# Patient Record
Sex: Male | Born: 1985 | Race: White | Hispanic: No | Marital: Married | Smoking: Never smoker
Health system: Southern US, Community
[De-identification: ages and names within clinical notes are randomized; demographics above are authoritative.]

---

## 2015-12-14 ENCOUNTER — Emergency Department
Admission: EM | Admit: 2015-12-14 | Discharge: 2015-12-14 | Disposition: A | Discharge status: Home / Self Care | Payer: BLUE CROSS/BLUE SHIELD | Attending: Emergency Medicine | Admitting: Emergency Medicine

## 2015-12-14 ENCOUNTER — Emergency Department: Payer: BLUE CROSS/BLUE SHIELD

## 2015-12-14 DIAGNOSIS — I82402 Acute embolism and thrombosis of unspecified deep veins of left lower extremity: Secondary | ICD-10-CM | POA: Insufficient documentation

## 2015-12-14 DIAGNOSIS — M79605 Pain in left leg: Secondary | ICD-10-CM | POA: Diagnosis present

## 2015-12-14 MED ORDER — APIXABAN 5 MG PO TABS
ORAL_TABLET | ORAL | Status: AC
  Administered 2015-12-14: 10 mg via ORAL
  Filled 2015-12-14: qty 2

## 2015-12-14 MED ORDER — APIXABAN 5 MG PO TABS
10.0000 mg | ORAL_TABLET | Freq: Once | ORAL | Status: AC
  Administered 2015-12-14: 10 mg via ORAL

## 2015-12-14 MED ORDER — APIXABAN 5 MG PO TABS: ORAL_TABLET | ORAL | 0 refills | Status: DC

## 2015-12-14 NOTE — ED Notes (Signed)
Patient transported to Ultrasound 

## 2015-12-14 NOTE — ED Notes (Signed)
Pt discharged to home.  Family member driving.  Discharge instructions reviewed.  Verbalized understanding.  No questions or concerns at this time.  Teach back verified.  Pt in NAD.  No items left in ED.   

## 2015-12-14 NOTE — ED Provider Notes (Signed)
[facility] Emergency Department Provider Note   ____________________________________________   First MD Initiated Contact with Patient 12/14/15 1643     (approximate)  I have reviewed the triage vital signs and the nursing notes.   HISTORY  Chief Complaint Leg Pain   HPI Jacolyn ReedyRobert Larke is a 30 y.o. male with family history of clotting disorders and multiple DVTs and his family who is presenting with left popliteal fossa pain as well as erythema. He says that he thinks he may have a superficial thrombophlebitis but was sent in by his family with a concern for DVT. He says he was having pain to his left popliteal fossa yesterday but noticed the erythema today. He denies any recent long trips in a car or plane. Denies any injury. Says he also has a history of varicose veins and has pain along the posterior varicose vein of the popliteal fossa as well. He says that he himself has never had any blood clots. Denies any chest pain or shortness of breath. Denies any swelling to the bilateral lower extremity is.   History reviewed. No pertinent past medical history.  There are no active problems to display for this patient.   History reviewed. No pertinent surgical history.  Prior to Admission medications   Not on File    Allergies Review of patient's allergies indicates no known allergies.  No family history on file.  Social History Social History  Substance Use Topics  . Smoking status: Never Smoker  . Smokeless tobacco: Never Used  . Alcohol use Yes     Comment: socially    Review of Systems Constitutional: No fever/chills Eyes: No visual changes. ENT: No sore throat. Cardiovascular: Denies chest pain. Respiratory: Denies shortness of breath. Gastrointestinal: No abdominal pain.  No nausea, no vomiting.  No diarrhea.  No constipation. Genitourinary: Negative for dysuria. Musculoskeletal: Negative for back pain. Skin: As above Neurological:  Negative for headaches, focal weakness or numbness.  10-point ROS otherwise negative.  ____________________________________________   PHYSICAL EXAM:  VITAL SIGNS: ED Triage Vitals [12/14/15 1622]  Enc Vitals Group     BP 132/86     Pulse Rate (!) 101     Resp 18     Temp 98.1 F (36.7 C)     Temp Source Oral     SpO2 100 %     Weight 190 lb (86.2 kg)     Height 6\' 4"  (1.93 m)     Head Circumference      Peak Flow      Pain Score 1     Pain Loc      Pain Edu?      Excl. in GC?     Constitutional: Alert and oriented. Well appearing and in no acute distress. Eyes: Conjunctivae are normal. PERRL. EOMI. Head: Atraumatic. Nose: No congestion/rhinnorhea. Mouth/Throat: Mucous membranes are moist.  Neck: No stridor.   Cardiovascular: Normal rate, regular rhythm. Grossly normal heart sounds.   Respiratory: Normal respiratory effort.  No retractions. Lungs CTAB. Gastrointestinal: Soft and nontender. No distention.  Musculoskeletal: No lower extremity tenderness nor edema.  No joint effusions. Neurologic:  Normal speech and language. No gross focal neurologic deficits are appreciated. No gait instability. Skin:  Left-sided popliteal fossa with erythema which extends from the mid popliteal fossa up to the proximal third of the thigh posteriorly. The dimensions of the spot are about 4 cm in the medial lateral direction and about 8 cm, extending, proximally. There is mild tenderness  to palpation as well as warmth. The patient also has bilateral lower extremity varicose veins including a varicose vein which is sneaking up the left popliteal fossa which is mildly tender to palpation without obvious clot. Psychiatric: Mood and affect are normal. Speech and behavior are normal.  ____________________________________________   LABS (all labs ordered are listed, but only abnormal results are displayed)  Labs Reviewed - No data to  display ____________________________________________  EKG   ____________________________________________  RADIOLOGY  US Venous Img Lower Unilateral Left (Accession [id number]) (Order [id number])  Imaging  Date: 12/14/2015 Department: [facility] EMERGENCY DEPARTMENT Released By/Authorizing: Myrna Blazer, MD (auto-released)  PACS Images   Show images for US Venous Img Lower Unilateral Left  Addendum   ADDENDUM REPORT: 12/14/2015 19:29  ADDENDUM: Please note the peripheral orientation of the clot in the popliteal vein may indicate a subacute or chronic nature. However, acute thrombus is not excluded. No prior studies are available for comparison. Please correlate with clinical exam and history of possible prior DVT.  These results were called by telephone at the time of interpretation on 12/14/2015 at 7:28 pm to Dr. Gladstone Pih , who verbally acknowledged these results.   Electronically Signed   By: Elgie Collard M.D.   On: 12/14/2015 19:29     US Venous Img Lower Unilateral Left (Accession [id number]) (Order [id number])  Imaging  Date: 12/14/2015 Department: [facility] EMERGENCY DEPARTMENT Released By/Authorizing: Myrna Blazer, MD (auto-released)  PACS Images   Show images for US Venous Img Lower Unilateral Left  Addendum   ADDENDUM REPORT: 12/14/2015 19:29  ADDENDUM: Please note the peripheral orientation of the clot in the popliteal vein may indicate a subacute or chronic nature. However, acute thrombus is not excluded. No prior studies are available for comparison. Please correlate with clinical exam and history of possible prior DVT.  These results were called by telephone at the time of interpretation on 12/14/2015 at 7:28 pm to Dr. Gladstone Pih , who verbally acknowledged these results.   Electronically Signed   By: Elgie Collard M.D.   On: 12/14/2015 19:29   Addended by  Elgie Collard, MD on 12/14/2015 7:32 PM    Study Result   CLINICAL DATA:  30 year old male with left popliteal fossa erythema x2 days.  EXAM: Left LOWER EXTREMITY VENOUS DOPPLER ULTRASOUND  TECHNIQUE: Gray-scale sonography with graded compression, as well as color Doppler and duplex ultrasound were performed to evaluate the lower extremity deep venous systems from the level of the common femoral vein and including the common femoral, femoral, profunda femoral, popliteal and calf veins including the posterior tibial, peroneal and gastrocnemius veins when visible. The superficial great saphenous vein was also interrogated. Spectral Doppler was utilized to evaluate flow at rest and with distal augmentation maneuvers in the common femoral, femoral and popliteal veins.  COMPARISON:  None.  FINDINGS: Contralateral Common Femoral Vein: Respiratory phasicity is normal and symmetric with the symptomatic side. No evidence of thrombus. Normal compressibility.  Common Femoral Vein: No evidence of thrombus. Normal compressibility, respiratory phasicity and response to augmentation.  Saphenofemoral Junction: No evidence of thrombus. Normal compressibility and flow on color Doppler imaging.  Profunda Femoral Vein: No evidence of thrombus. Normal compressibility and flow on color Doppler imaging.  Femoral Vein: No evidence of thrombus. Normal compressibility, respiratory phasicity and response to augmentation.  Popliteal Vein: There is incomplete compressibility of the distal popliteal vein with nonocclusive thrombus primarily in the periphery of the vessel.  Calf Veins:  No evidence of thrombus. Normal compressibility and flow on color Doppler imaging.  Superficial Great Saphenous Vein: No evidence of thrombus. Normal compressibility and flow on color Doppler imaging.  Venous Reflux:  None.  Other Findings: Dilated thrombosis superficial varicose veins noted in the  posterior knee and popliteal region.  IMPRESSION: Nonocclusive thrombus of the distal popliteal vein.  Thrombosed dilated superficial varicose veins in posterior to the knee.  Electronically Signed: By: Elgie Collard M.D. On: 12/14/2015 19:26       ____________________________________________   PROCEDURES  Procedure(s) performed:   Procedures  Critical Care performed:   ____________________________________________   INITIAL IMPRESSION / ASSESSMENT AND PLAN / ED COURSE  Pertinent labs & imaging results that were available during my care of the patient were reviewed by me and considered in my medical decision making (see chart for details).  ----------------------------------------- 8:42 PM on 12/14/2015 -----------------------------------------  Discussed case with Dr. due of vascular surgery who recommends eliquis or per DACs. Discussed the diagnosis with the patient as well as the recommendation from Dr. due to start anticoagulation. The patient understands the plan of going to comply. He says that he would rather eliquis because his father has tolerated eliquis well and has had a clotting disorder diagnosed in the past. We also discussed precautions with these medications including bleeding risk and need to come into the emergency department for any injury especially head injury. 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  Clinical Course     ____________________________________________   FINAL CLINICAL IMPRESSION(S) / ED DIAGNOSES  DVT.    NEW MEDICATIONS STARTED DURING THIS VISIT:  New Prescriptions   No medications on file     Note:  This document was prepared using Dragon voice recognition software and may include unintentional dictation errors.    Myrna Blazer, MD 12/14/15 [phone]

## 2015-12-14 NOTE — ED Notes (Signed)
C/o redness, swelling to L post knee. Pt states he thought it was a superficial phlebitis but his family told him to get checked out d/t substantial family hx DVT. States redness was not present yesterday but pain was worse yesterday.

## 2015-12-14 NOTE — ED Triage Notes (Signed)
Pt has posterior knee pain and tender to touch. No injury. Denies warmth to touch. Hx of blood clotting and thrombosis in family. Ambulatory.

## 2015-12-19 ENCOUNTER — Telehealth: Payer: Self-pay | Admitting: Emergency Medicine

## 2015-12-19 NOTE — Telephone Encounter (Signed)
Patient called and needs information sent to Dr. Glorianne Manchesterhomas Ortel--[facility].  I called the office and faxed ED notes.  It will be reviewed in 3-5 days and they will contact the patient.  I advised the patient of this as well as that appt will likely be November.  I advised patient to also contact his pcp to see if any testing could be completed before hematology appt.  He will do that.  He wants to see the hematologist due to direct family member and family history of factor and antithrombin deficiencies.

## 2021-05-17 ENCOUNTER — Emergency Department: Payer: No Typology Code available for payment source

## 2021-05-17 ENCOUNTER — Emergency Department
Admission: EM | Admit: 2021-05-17 | Discharge: 2021-05-17 | Disposition: A | Discharge status: Home / Self Care | Payer: No Typology Code available for payment source | Attending: Emergency Medicine | Admitting: Emergency Medicine

## 2021-05-17 ENCOUNTER — Other Ambulatory Visit: Payer: Self-pay

## 2021-05-17 DIAGNOSIS — I803 Phlebitis and thrombophlebitis of lower extremities, unspecified: Secondary | ICD-10-CM | POA: Diagnosis not present

## 2021-05-17 DIAGNOSIS — Z7901 Long term (current) use of anticoagulants: Secondary | ICD-10-CM | POA: Insufficient documentation

## 2021-05-17 DIAGNOSIS — R2242 Localized swelling, mass and lump, left lower limb: Secondary | ICD-10-CM | POA: Diagnosis present

## 2021-05-17 DIAGNOSIS — I809 Phlebitis and thrombophlebitis of unspecified site: Secondary | ICD-10-CM

## 2021-05-17 LAB — CBC WITH DIFFERENTIAL/PLATELET
Abs Immature Granulocytes: 0.03 10*3/uL (ref 0.00–0.07)
Basophils Absolute: 0 10*3/uL (ref 0.0–0.1)
Basophils Relative: 1 %
Eosinophils Absolute: 0.2 10*3/uL (ref 0.0–0.5)
Eosinophils Relative: 2 %
HCT: 37.6 % — ABNORMAL LOW (ref 39.0–52.0)
Hemoglobin: 13.5 g/dL (ref 13.0–17.0)
Immature Granulocytes: 0 %
Lymphocytes Relative: 30 %
Lymphs Abs: 2.5 10*3/uL (ref 0.7–4.0)
MCH: 31.8 pg (ref 26.0–34.0)
MCHC: 35.9 g/dL (ref 30.0–36.0)
MCV: 88.5 fL (ref 80.0–100.0)
Monocytes Absolute: 0.5 10*3/uL (ref 0.1–1.0)
Monocytes Relative: 6 %
Neutro Abs: 5.1 10*3/uL (ref 1.7–7.7)
Neutrophils Relative %: 61 %
Platelets: 300 10*3/uL (ref 150–400)
RBC: 4.25 MIL/uL (ref 4.22–5.81)
RDW: 12.4 % (ref 11.5–15.5)
WBC: 8.3 10*3/uL (ref 4.0–10.5)
nRBC: 0 % (ref 0.0–0.2)

## 2021-05-17 LAB — COMPREHENSIVE METABOLIC PANEL
ALT: 12 U/L (ref 0–44)
AST: 18 U/L (ref 15–41)
Albumin: 4.5 g/dL (ref 3.5–5.0)
Alkaline Phosphatase: 49 U/L (ref 38–126)
Anion gap: 10 (ref 5–15)
BUN: 15 mg/dL (ref 6–20)
CO2: 28 mmol/L (ref 22–32)
Calcium: 9.4 mg/dL (ref 8.9–10.3)
Chloride: 101 mmol/L (ref 98–111)
Creatinine, Ser: 1.13 mg/dL (ref 0.61–1.24)
GFR, Estimated: >60 mL/min (ref >60)
Glucose, Bld: 106 mg/dL — ABNORMAL HIGH (ref 70–99)
Potassium: 3.6 mmol/L (ref 3.5–5.1)
Sodium: 139 mmol/L (ref 135–145)
Total Bilirubin: 0.8 mg/dL (ref 0.3–1.2)
Total Protein: 7.4 g/dL (ref 6.5–8.1)

## 2021-05-17 MED ORDER — APIXABAN 2.5 MG PO TABS: 2.5000 mg | ORAL_TABLET | Freq: Two times a day (BID) | ORAL | 0 refills | Status: AC

## 2021-05-17 NOTE — ED Triage Notes (Signed)
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 Pt with left knee posterior swelling and pain.

## 2021-05-17 NOTE — ED Provider Notes (Signed)
[facility] Provider Note    Event Date/Time   First MD Initiated Contact with Patient 05/17/21 2044     (approximate)   History   Leg Swelling   HPI  Chase Hayes is a 36 y.o. male with history of factor V, DVT and as listed in EMR presents to the emergency department for treatment and evaluation of tenderness in the left knee with some posterior swelling and tenderness.  Recent travel and has not been taking his Eliquis daily as prescribed.  No shortness of breath or chest pain.Marland Kitchen      Physical Exam   Triage Vital Signs: ED Triage Vitals  Enc Vitals Group     BP 05/17/21 1957 121/77     Pulse Rate 05/17/21 1957 (!) 103     Resp 05/17/21 1957 16     Temp 05/17/21 1959 98.3 F (36.8 C)     Temp src --      SpO2 05/17/21 1957 100 %     Weight 05/17/21 1959 215 lb (97.5 kg)     Height 05/17/21 1959 6\' 4"  (1.93 m)     Head Circumference --      Peak Flow --      Pain Score 05/17/21 1959 1     Pain Loc --      Pain Edu? --      Excl. in GC? --     Most recent vital signs: Vitals:   05/17/21 1959 05/17/21 2250  BP:  114/81  Pulse:  78  Resp:  17  Temp: 98.3 F (36.8 C)   SpO2:  98%    General: Awake, no distress.  CV:  Good peripheral perfusion.  Resp:  Normal effort.  Abd:  No distention.  Other:  Palpable varicose vein and area of tenderness.  Mild erythema and edema of the left lower extremity.   ED Results / Procedures / Treatments   Labs (all labs ordered are listed, but only abnormal results are displayed) Labs Reviewed  CBC WITH DIFFERENTIAL/PLATELET - Abnormal; Notable for the following components:      Result Value   HCT 37.6 (*)    All other components within normal limits  COMPREHENSIVE METABOLIC PANEL - Abnormal; Notable for the following components:   Glucose, Bld 106 (*)    All other components within normal limits     EKG  Not indicated   RADIOLOGY  Image and radiology report reviewed by  me.  Ultrasound of the left lower extremity is negative for DVT however there is thrombophlebitis in the left distal thigh and proximal calf.  PROCEDURES:  Critical Care performed: No  Procedures   MEDICATIONS ORDERED IN ED: Medications - No data to display   IMPRESSION / MDM / ASSESSMENT AND PLAN / ED COURSE   I have reviewed the triage note.  Differential diagnosis includes, but is not limited to, DVT, thrombophlebitis, musculoskeletal strain   36 year old male presenting to the emergency department for treatment and evaluation of the left lower extremity pain.  See HPI for further details.  Ultrasound is negative for DVT, however there is a thrombophlebitis in the area of pain.  Patient states that he has been taking the Eliquis as prescribed.  He requested a new prescription which was submitted to his pharmacy.  Wife states that she has an appointment for him scheduled with vein and vascular specialist in early March.  They were advised to keep that appointment as scheduled.  If symptoms change,  worsen, or he develops new symptoms he is to either see primary care or return to the emergency department.      FINAL CLINICAL IMPRESSION(S) / ED DIAGNOSES   Final diagnoses:  Thrombophlebitis     Rx / DC Orders   ED Discharge Orders          Ordered    apixaban (ELIQUIS) 2.5 MG TABS tablet  2 times daily        05/17/21 2239             Note:  This document was prepared using Dragon voice recognition software and may include unintentional dictation errors.   Chinita Pester, FNP 05/20/21 1315    Minna Antis, MD 05/24/21 [phone]

## 2021-05-17 NOTE — ED Notes (Signed)
See triage note. Pt ambulatory to room; resp reg/unlabored; skin dry; pt laying calmly on stretcher. Visitor remains with pt.

## 2023-06-22 IMAGING — US US EXTREM LOW VENOUS*L*
1 series · 14 of 24 positions shown · non-contrast
Comparison: None.

CLINICAL DATA: Swelling, prior DVT, factor 5 Ceejay

EXAM:
LEFT LOWER EXTREMITY VENOUS DOPPLER ULTRASOUND
TECHNIQUE: Gray-scale sonography with compression, as well as color and duplex
ultrasound, were performed to evaluate the deep venous system(s)
from the level of the common femoral vein through the popliteal and
proximal calf veins.

[Series 1: us venous img lower uni left (dvt) · portal-venous · 41 acquisitions, 14 frames shown]
[im 1/41]
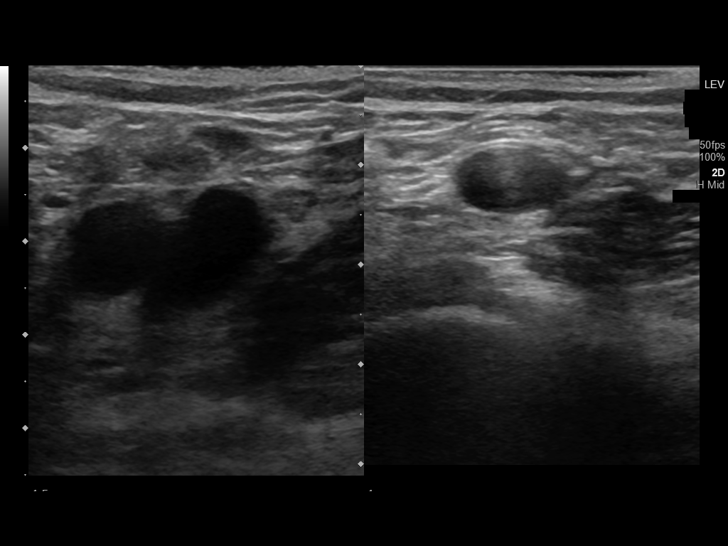
[im 4/41]
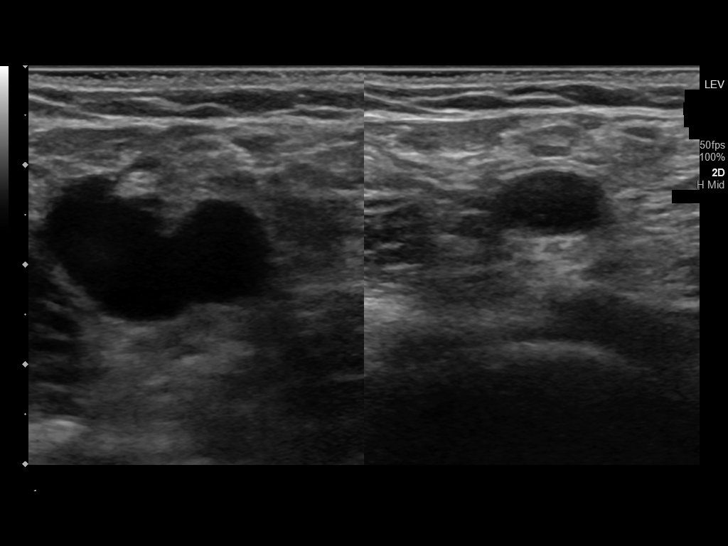
[im 9/41]
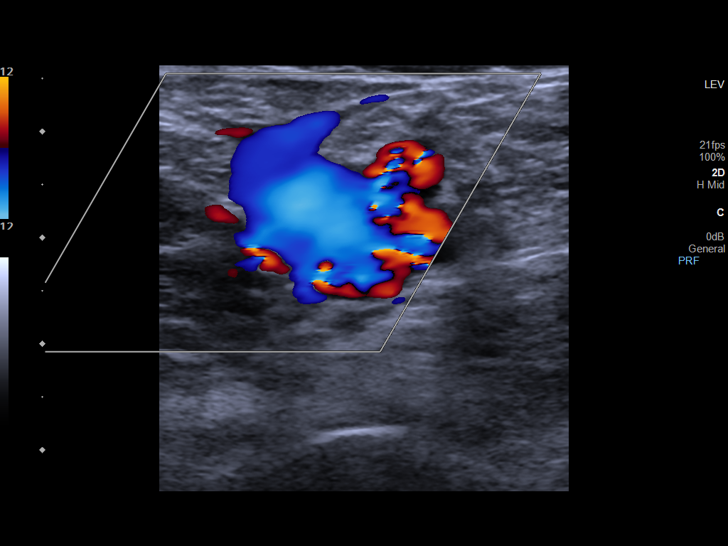
[im 13/41]
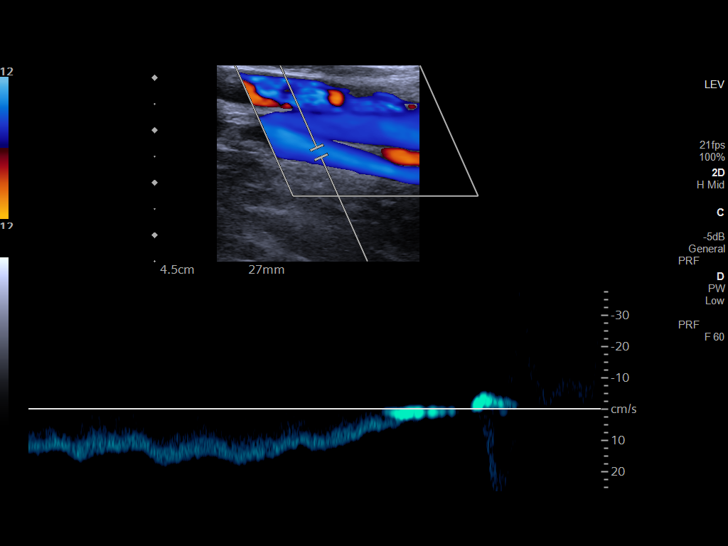
[im 16/41]
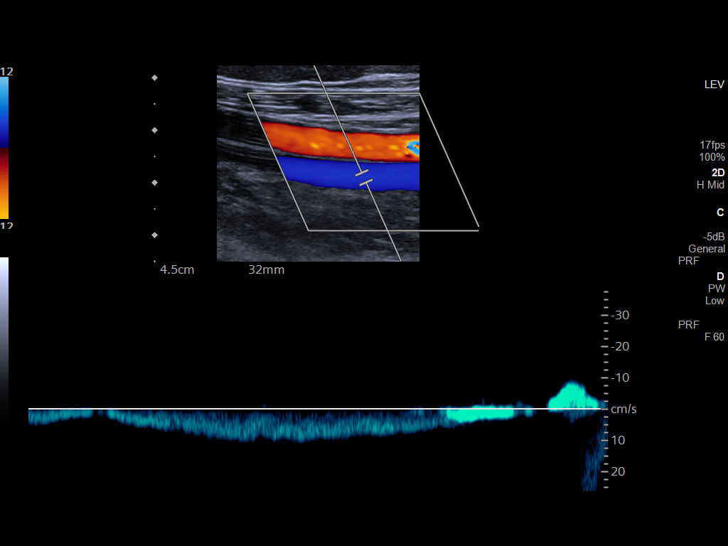
[im 20/41]
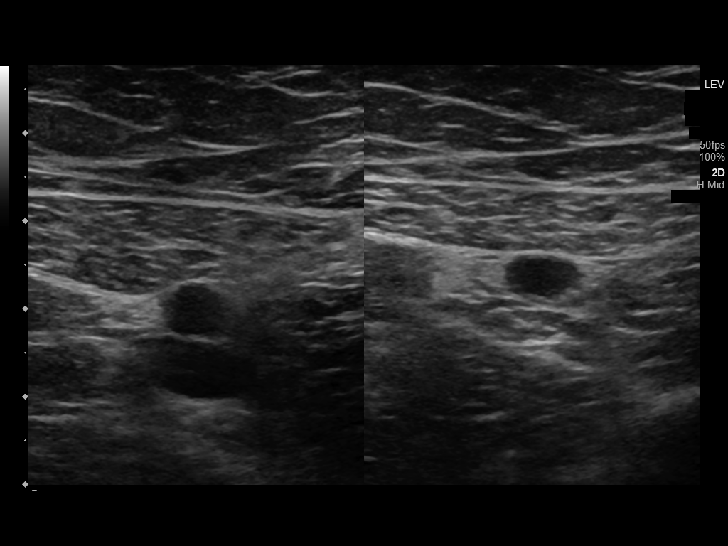
[im 23/41]
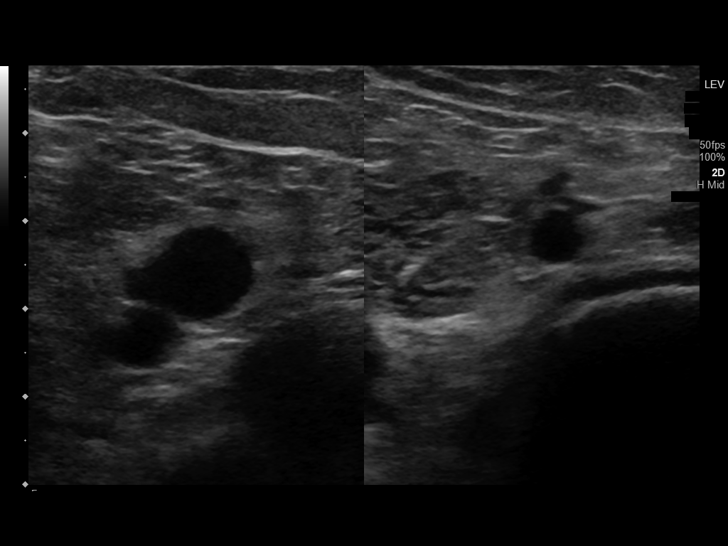
[im 27/41]
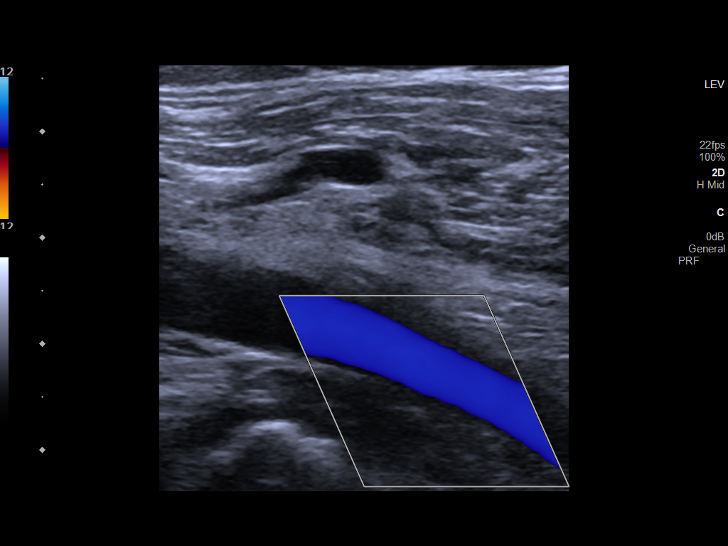
[im 30/41]
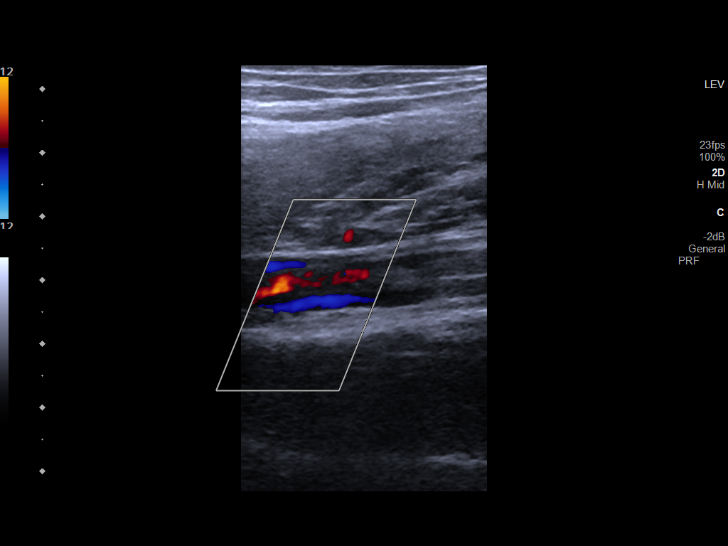
[im 34/41]
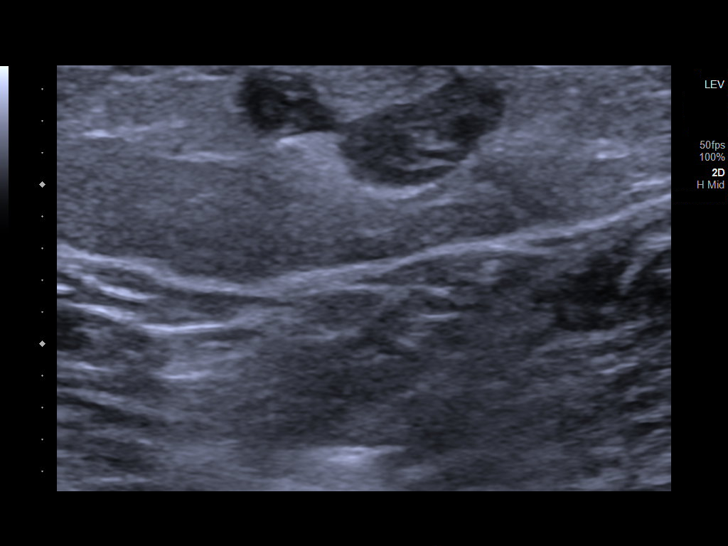
[im 35/41]
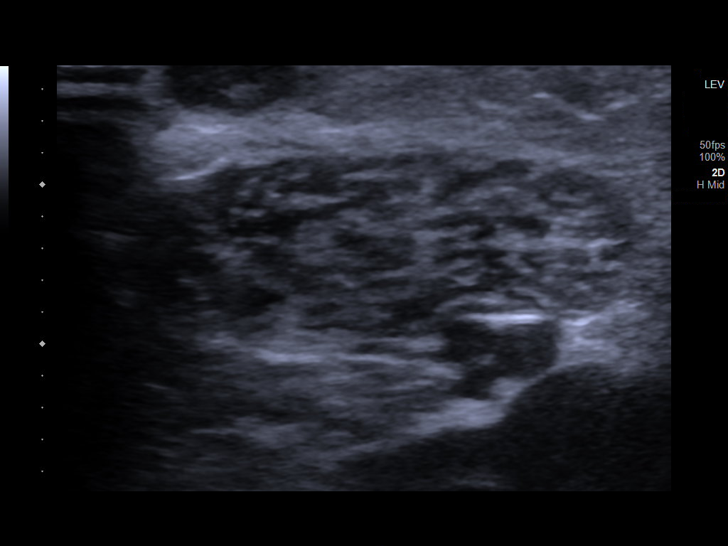
[im 37/41]
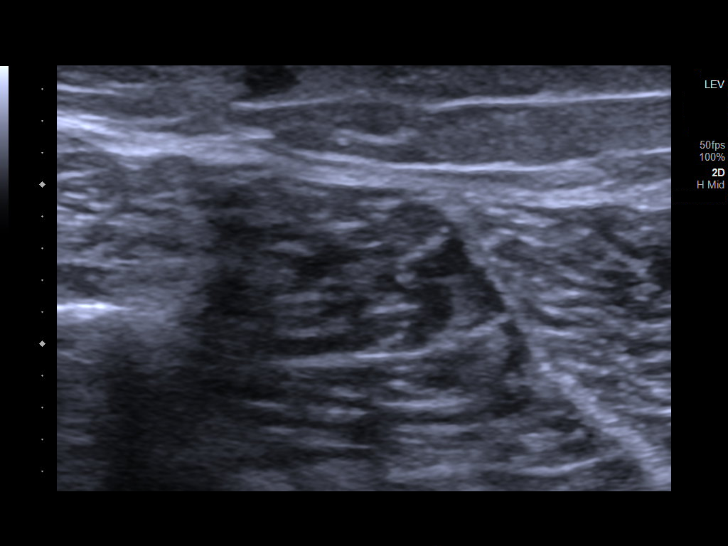
[im 39/41]
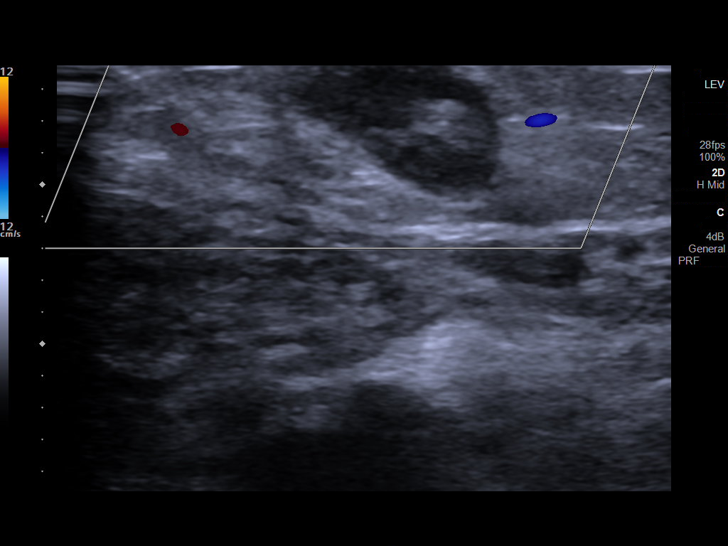
[im 41/41]
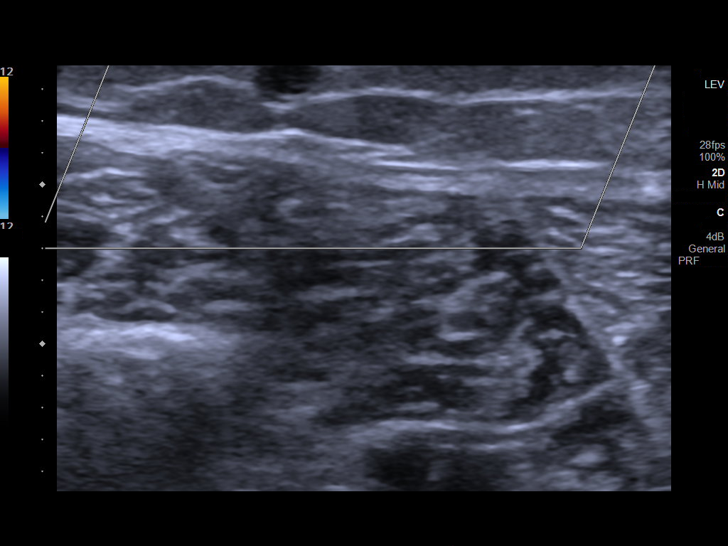

[14 of 24 positions shown; findings below may reference images not displayed]

FINDINGS: VENOUS

Normal compressibility of the common femoral, superficial femoral,
and popliteal veins, as well as the visualized calf veins.
Visualized portions of profunda femoral vein and great saphenous
vein unremarkable. No filling defects to suggest DVT on grayscale or
color Doppler imaging. Doppler waveforms show normal direction of
venous flow, normal respiratory plasticity and response to
augmentation.

Limited views of the contralateral common femoral vein are
unremarkable.

OTHER

Superficial thrombophlebitis in the left distal thigh/proximal calf.

Limitations: none
IMPRESSION: No evidence of deep venous thrombosis.

Superficial thrombophlebitis in the left distal thigh/proximal calf.
